# Patient Record
Sex: Male | Born: 1965 | Race: White | Hispanic: No | Marital: Single
Health system: Southern US, Community
[De-identification: ages and names within clinical notes are randomized; demographics above are authoritative.]

---

## 2013-03-16 ENCOUNTER — Emergency Department: Payer: Self-pay | Admitting: Emergency Medicine

## 2013-03-16 LAB — DRUG SCREEN, URINE
Barbiturates, Ur Screen: NEGATIVE (ref ?–200)
Cocaine Metabolite,Ur ~~LOC~~: NEGATIVE (ref ?–300)
Opiate, Ur Screen: NEGATIVE (ref ?–300)
Phencyclidine (PCP) Ur S: NEGATIVE (ref ?–25)
Tricyclic, Ur Screen: NEGATIVE (ref ?–1000)

## 2013-03-16 LAB — CBC
HGB: 17.8 g/dL (ref 13.0–18.0)
MCHC: 33.9 g/dL (ref 32.0–36.0)
MCV: 94 fL (ref 80–100)
RBC: 5.57 10*6/uL (ref 4.40–5.90)
RDW: 14.4 % (ref 11.5–14.5)
WBC: 6.7 10*3/uL (ref 3.8–10.6)

## 2013-03-16 LAB — TSH: Thyroid Stimulating Horm: 2.97 u[IU]/mL

## 2013-03-16 LAB — COMPREHENSIVE METABOLIC PANEL
Albumin: 4.3 g/dL (ref 3.4–5.0)
Alkaline Phosphatase: 89 U/L (ref 50–136)
Anion Gap: 3 — ABNORMAL LOW (ref 7–16)
BUN: 8 mg/dL (ref 7–18)
Co2: 29 mmol/L (ref 21–32)
Creatinine: 0.95 mg/dL (ref 0.60–1.30)
EGFR (African American): >60
Glucose: 70 mg/dL (ref 65–99)
Potassium: 3.9 mmol/L (ref 3.5–5.1)
SGPT (ALT): 28 U/L (ref 12–78)
Total Protein: 8.3 g/dL — ABNORMAL HIGH (ref 6.4–8.2)

## 2013-03-16 LAB — ACETAMINOPHEN LEVEL: Acetaminophen: <2 ug/mL

## 2013-03-16 LAB — ETHANOL: Ethanol %: 0.138 % — ABNORMAL HIGH (ref 0.000–0.080)

## 2013-03-16 LAB — SALICYLATE LEVEL: Salicylates, Serum: 5.7 mg/dL — ABNORMAL HIGH

## 2013-04-14 ENCOUNTER — Inpatient Hospital Stay: Payer: Self-pay | Admitting: Internal Medicine

## 2013-04-14 LAB — URINALYSIS, COMPLETE
Bacteria: NONE SEEN
Bilirubin,UR: NEGATIVE
Glucose,UR: NEGATIVE mg/dL (ref 0–75)
Leukocyte Esterase: NEGATIVE
Ph: 5 (ref 4.5–8.0)
Specific Gravity: 1.015 (ref 1.003–1.030)

## 2013-04-14 LAB — CBC
HCT: 40.8 % (ref 40.0–52.0)
HGB: 13.9 g/dL (ref 13.0–18.0)
MCH: 32.3 pg (ref 26.0–34.0)
MCV: 94 fL (ref 80–100)
RDW: 13.8 % (ref 11.5–14.5)
WBC: 20.8 10*3/uL — ABNORMAL HIGH (ref 3.8–10.6)

## 2013-04-14 LAB — TROPONIN I
Troponin-I: <0.02 ng/mL
Troponin-I: <0.02 ng/mL

## 2013-04-14 LAB — BASIC METABOLIC PANEL
BUN: 11 mg/dL (ref 7–18)
Calcium, Total: 9.2 mg/dL (ref 8.5–10.1)
Chloride: 98 mmol/L (ref 98–107)
Creatinine: 0.79 mg/dL (ref 0.60–1.30)
EGFR (African American): >60
Glucose: 88 mg/dL (ref 65–99)
Osmolality: 263 (ref 275–301)
Potassium: 4.4 mmol/L (ref 3.5–5.1)

## 2013-04-14 LAB — CK TOTAL AND CKMB (NOT AT ARMC)
CK, Total: 159 U/L (ref 35–232)
CK, Total: 87 U/L (ref 35–232)
CK-MB: 4.4 ng/mL — ABNORMAL HIGH (ref 0.5–3.6)
CK-MB: 2.7 ng/mL (ref 0.5–3.6)

## 2013-04-28 ENCOUNTER — Emergency Department: Payer: Self-pay | Admitting: Emergency Medicine

## 2013-08-01 ENCOUNTER — Emergency Department: Payer: Self-pay | Admitting: Emergency Medicine

## 2013-08-01 LAB — COMPREHENSIVE METABOLIC PANEL
ALBUMIN: 4 g/dL (ref 3.4–5.0)
ALK PHOS: 64 U/L
ALT: 21 U/L (ref 12–78)
AST: 27 U/L (ref 15–37)
Anion Gap: 6 — ABNORMAL LOW (ref 7–16)
BUN: 10 mg/dL (ref 7–18)
Bilirubin,Total: 0.3 mg/dL (ref 0.2–1.0)
CALCIUM: 8.9 mg/dL (ref 8.5–10.1)
CREATININE: 0.82 mg/dL (ref 0.60–1.30)
Chloride: 105 mmol/L (ref 98–107)
Co2: 24 mmol/L (ref 21–32)
EGFR (African American): >60
GLUCOSE: 90 mg/dL (ref 65–99)
OSMOLALITY: 269 (ref 275–301)
POTASSIUM: 3.6 mmol/L (ref 3.5–5.1)
Sodium: 135 mmol/L — ABNORMAL LOW (ref 136–145)
Total Protein: 7.8 g/dL (ref 6.4–8.2)

## 2013-08-01 LAB — CK TOTAL AND CKMB (NOT AT ARMC)
CK, TOTAL: 88 U/L
CK-MB: 1.3 ng/mL (ref 0.5–3.6)

## 2013-08-01 LAB — CBC
HCT: 45.9 % (ref 40.0–52.0)
HGB: 15.3 g/dL (ref 13.0–18.0)
MCH: 31.6 pg (ref 26.0–34.0)
MCHC: 33.3 g/dL (ref 32.0–36.0)
MCV: 95 fL (ref 80–100)
Platelet: 332 10*3/uL (ref 150–440)
RBC: 4.85 10*6/uL (ref 4.40–5.90)
RDW: 13.6 % (ref 11.5–14.5)
WBC: 8.7 10*3/uL (ref 3.8–10.6)

## 2013-08-01 LAB — PROTIME-INR
INR: 1
PROTHROMBIN TIME: 13.4 s (ref 11.5–14.7)

## 2013-08-01 LAB — ETHANOL
Ethanol %: 0.194 % — ABNORMAL HIGH (ref 0.000–0.080)
Ethanol: 194 mg/dL

## 2013-08-01 LAB — TROPONIN I: Troponin-I: <0.02 ng/mL

## 2013-08-01 LAB — APTT: ACTIVATED PTT: 26.9 s (ref 23.6–35.9)

## 2013-10-31 IMAGING — CR DG OUTSIDE FILMS BODY
1 series · 1 of 1 positions shown · non-contrast
Comparison: none

[ap]
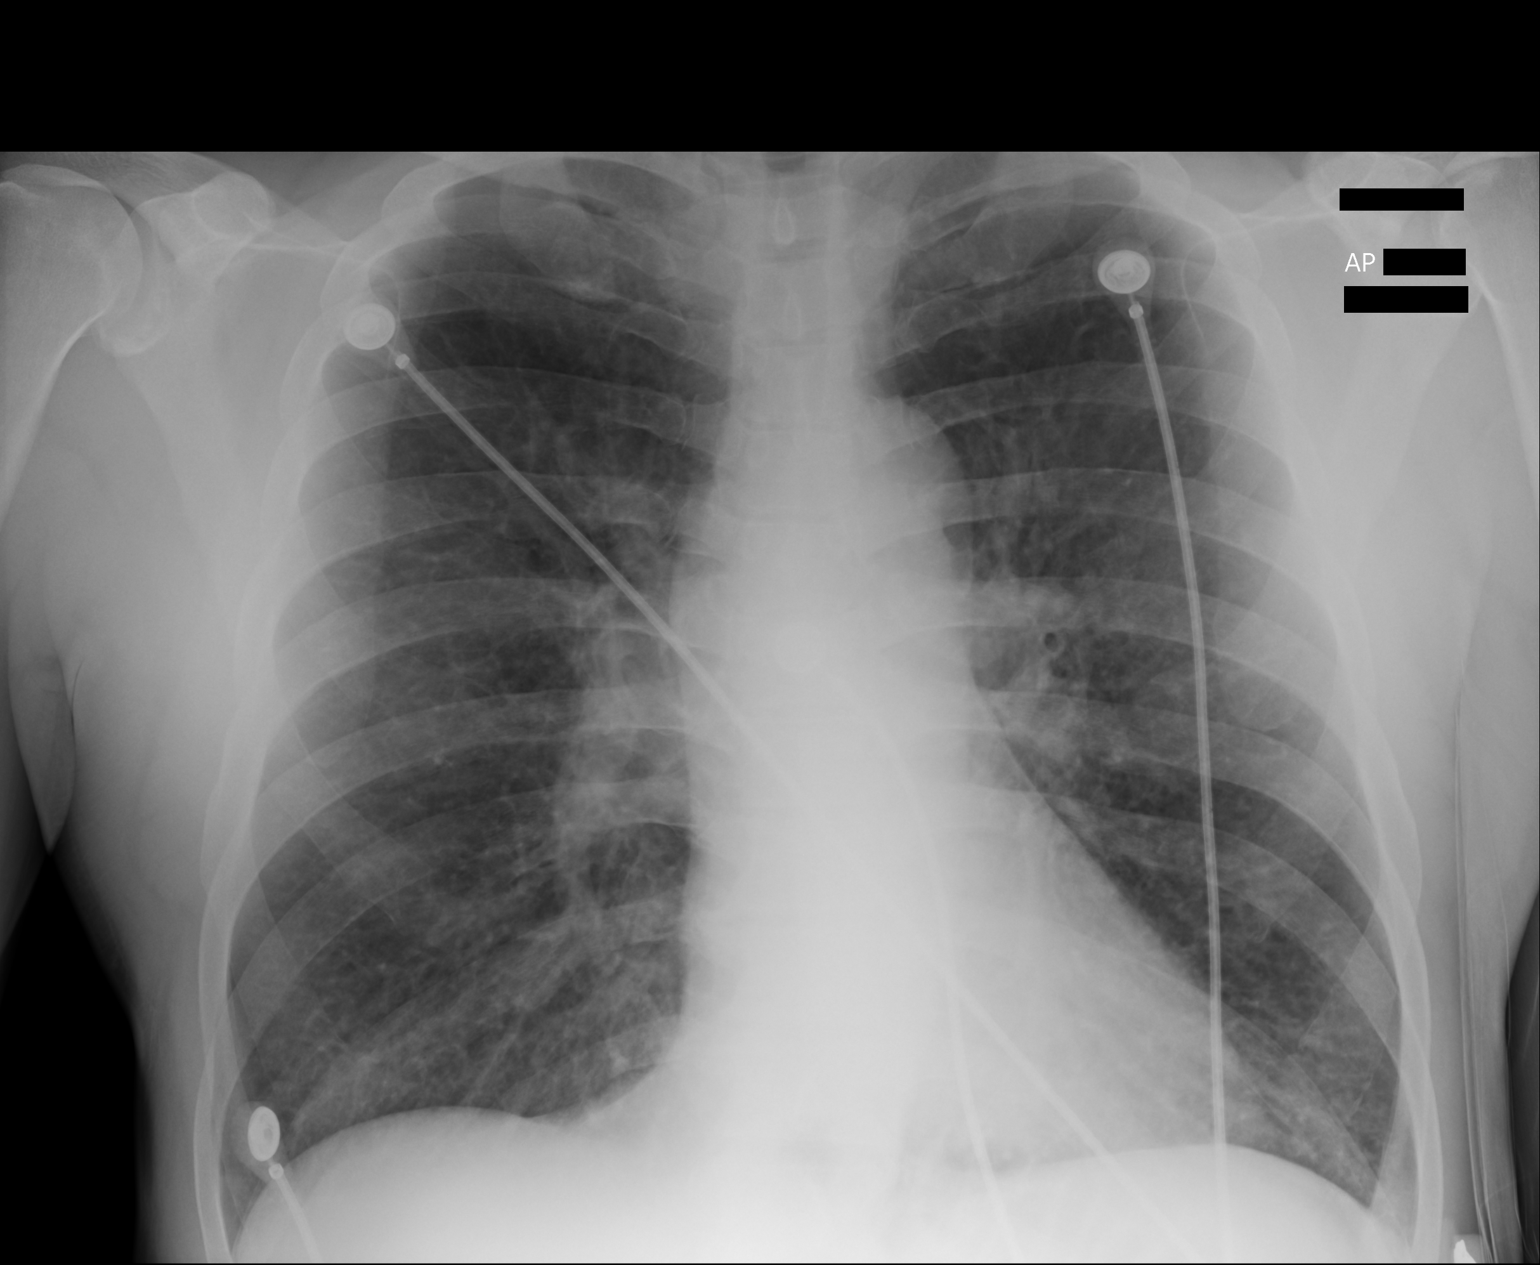

[1 of 1 positions shown; findings below may reference images not displayed]

Canned report from images found in remote index.

Refer to host system for actual result text.
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
## 2014-02-18 IMAGING — CR DG CHEST 1V PORT
1 series · 1 of 1 positions shown · non-contrast
Comparison: None.

CLINICAL DATA: Chest tightness and elevated blood pressure.

EXAM:
PORTABLE CHEST - 1 VIEW

[ap]
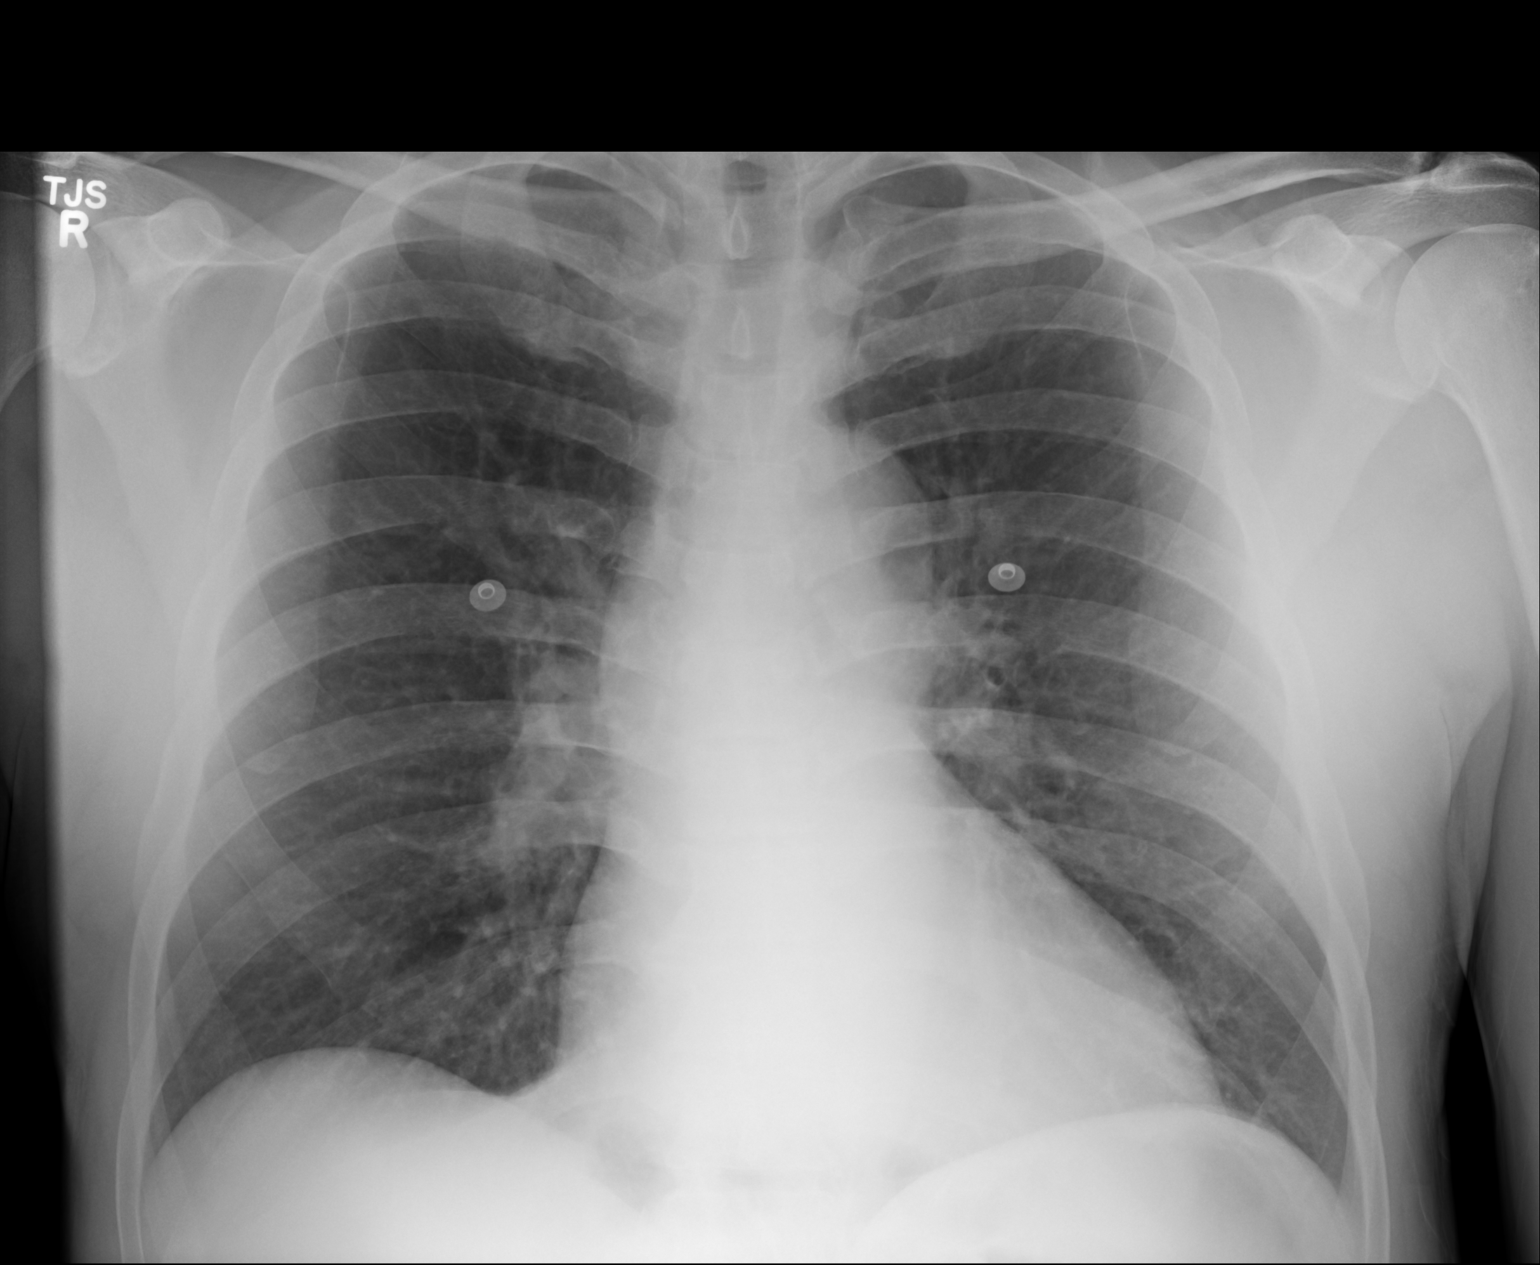

[1 of 1 positions shown; findings below may reference images not displayed]

FINDINGS: Lungs clear. Heart size normal. No pneumothorax or pleural effusion.
No focal bony abnormality.
IMPRESSION: Negative chest.

## 2014-02-21 DEATH — deceased

## 2014-10-13 NOTE — Discharge Summary (Signed)
PATIENT NAME:  Bryan Townsend, Bryan Townsend MR#:  [id number] DATE OF BIRTH:  04-Jan-1966  DATE OF ADMISSION:  04/14/2013 DATE OF DISCHARGE:  04/14/2013  The patient left AGAINST MEDICAL ADVICE.  No FINAL DIAGNOSES will be given since the patient left AMA.   PRESENTING COMPLAINT:  Shortness of breath and cough.   HISTORY OF PRESENT ILLNESS:  Bryan Townsend is a 49 year old Caucasian gentleman with a history of chronic tobacco abuse who comes to the Emergency Room with shortness of breath. He was admitted with acute hypoxic respiratory failure, was on BiPAP in the Emergency Room, which was weaned off to nasal cannula. He was started on Solu-Medrol and DuoNebs along with oral inhalers and empiric antibiotics. He did have some chest pain; however, his cardiac enzymes remained negative. The patient also has history of chronic headaches. He decided to leave AMA and hence FINAL DISCHARGE DIAGNOSES will not be given. He remained a FULL CODE.   TIME SPENT:  15 minutes.   ____________________________ Wylie HailSona A. Allena KatzPatel, MD sap:jm D: 04/15/2013 15:07:59 ET T: 04/15/2013 16:52:42 ET JOB#: [id number]  cc: Kajal Scalici A. Allena KatzPatel, MD, <Dictator> Willow OraSONA A Kalyn Dimattia MD ELECTRONICALLY SIGNED 04/17/2013 10:57

## 2014-10-13 NOTE — H&P (Signed)
PATIENT NAME:  Bryan Townsend, Bryan Townsend MR#:  [id number] DATE OF BIRTH:  May 26, 1966  DATE OF ADMISSION:  04/14/2013  REFERRING PHYSICIAN: Dr. Margarita GrizzleWoodruff.   PRIMARY CARE PHYSICIAN: None.   CHIEF COMPLAINT: Shortness of breath and chest pain and cough.   HISTORY OF PRESENT ILLNESS: This is a 49 year old male with significant past medical history of COPD and coronary artery disease status post stent, presents with complaints of shortness of breath, cough and chest pain. The patient reports that he has been smoking 1-1/2 packs a day since the age of 49. He reports he has been having 5 days of shortness of breath and cough with productive green sputum. Denies any fever or chills. The patient reports he has a history of COPD, but he is not taking any meds at home. As well, he reports over the last 2 days he has also been having some midsternal chest discomfort, nonradiating. He reports this does not resemble the chest pain when he had his MI in the past. In the ED, the patient was significantly in respiratory distress, where he required BiPAP. Upon my interview with the patient, whenever I removed the BiPAP, he desaturated to the low 80s on room air. The patient's chest x-ray did show COPD picture but did not show any acute opacity or infiltrate. The patient had mildly elevated BNP but denies any leg swelling or orthopnea. The patient reports significant improvement on BiPAP. The patient was found to have leukocytosis at 20,000. He denies any polyuria or dysuria. For chest pain, he was given 324 of aspirin. Denies any current chest pain. Had no EKG changes, with a negative troponin. The patient's ABG is showing pH of 7.32 and pCO2 of 48. Hospitalist service was requested to admit the patient for further treatment of his acute respiratory failure and COPD and evaluation of his chest pain.   PAST MEDICAL HISTORY:  1. COPD.  2. Coronary artery disease, status post cardiac stent.   ALLERGIES: No known drug allergies.   HOME  MEDICATIONS: None.   PAST SURGICAL HISTORY: Hernia surgery and left arm surgery.   FAMILY HISTORY: The patient reports history of coronary artery disease in the family but not at a young age.   SOCIAL HISTORY: The patient smokes 1-1/2 packs per day. Denies any alcohol use. Denies any illicit drug use. The patient tested positive for benzodiazepines and cannabinoids in the past in September 2014.   REVIEW OF SYSTEMS:  CONSTITUTIONAL: The patient denies fever, chills. Denies weakness, fatigue.  EYES: Denies blurry vision, double vision, inflammation, glaucoma.   ENT: Denies tinnitus, ear pain, epistaxis or discharge. RESPIRATORY: Complains of cough, wheezing, dyspnea and productive green sputum. Denies any hemoptysis.  CARDIOVASCULAR: Had chest discomfort. Denies edema, arrhythmia, palpitation, syncope.  GASTROINTESTINAL: Denies nausea, vomiting, diarrhea, abdominal pain, hematemesis, melena.  GENITOURINARY: Denies dysuria, hematuria, renal colic.  ENDOCRINE: Denies polyuria, polydipsia, heat or cold intolerance.  HEMATOLOGY: Denies anemia, easy bruising, bleeding diathesis.  INTEGUMENTARY: Denies acne, rash or skin lesions.  MUSCULOSKELETAL: Denies any gout, arthritis or cramps.  NEUROLOGIC: Denies numbness, weakness, dysarthria, epilepsy, CVA, TIA.   PSYCHIATRIC: Denies anxiety, insomnia, bipolar disorder.   PHYSICAL EXAMINATION:  VITAL SIGNS: Temperature 98.3, pulse 94, respiratory rate 24, blood pressure 141/82, saturating 99% on BiPAP.  GENERAL: Young male sitting on bed in mild respiratory distress on BiPAP.  HEENT: Head atraumatic, normocephalic. Pupils equal and reactive to light. Pink conjunctivae. Anicteric sclerae. Moist oral mucosa.  NECK: Supple. No thyromegaly. No JVD.  CHEST: Had significantly decreased  air entry bilaterally with diffuse wheezing. Cannot speak full sentences due to shortness of breath. Whenever he is taken off BiPAP, he desaturates into the low 80s.   CARDIOVASCULAR: S1, S2 heard. No rubs, murmurs or gallops.  ABDOMEN: Soft, nontender, nondistended. Bowel sounds present.  EXTREMITIES: No edema. No clubbing. No cyanosis.  PSYCHIATRIC: Appropriate affect. Awake, alert x 3. Intact judgment and insight.  NEUROLOGIC: Cranial nerves grossly intact. Motor is 5 out of 5. No focal deficits.  LYMPHATIC: No cervical or supraclavicular lymphadenopathy.   SKIN: Normal skin turgor. Warm and dry. No rash.   PERTINENT LABORATORIES: Glucose 88, BNP 530, BUN 11, creatinine 0.79, sodium 132, potassium 4.4, chloride 98, CO2 of 25. Troponin 0.02, CK-MB 5.5, total CK 276.   White blood cells 20.8, hemoglobin 13.9, hematocrit 40.8, platelets 424. PH 7.32, pCO2 48, pO2 96.   Chest x-ray showing COPD picture with no opacity or infiltrate.   EKG showing sinus tachycardia at 113 beats per minute, without significant T wave depression.   ASSESSMENT AND PLAN:  1. Acute respiratory failure: The patient appears to be having acute hypoxic respiratory failure. He is on BiPAP. This is most likely related to his chronic obstructive pulmonary disease exacerbation. Will continue him on BiPAP.  2. Chronic obstructive pulmonary disease exacerbation: Will continue the patient on Solu-Medrol 125 every 6 hours, on DuoNebs every 4 and albuterol every 2 hours as needed. Given the fact he is having productive sputum with cough and leukocytosis, will be started on Levaquin.  3. Chest pain: Currently, the patient is chest pain-free. Reports this pain does not resemble the chest pain he had when he had his heart attack in the past. He was already given 324 of aspirin. Will admit him to telemetry unit. Will continue to cycle his cardiac enzymes and follow the trend, and will have him on sublingual nitroglycerin.  4. Leukocytosis: Etiology is unclear. Will check septic workup, blood culture and urinalysis.  5. History of coronary artery disease: Will start the patient on aspirin.  6.  Tobacco abuse: The patient was counseled. Will be started on NicoDerm patch.  7. Deep vein thrombosis prophylaxis. Subcutaneous heparin.  8. Gastrointestinal prophylaxis: On proton pump inhibitor, especially he is on large dose steroids.   CODE STATUS: FULL CODE.   TOTAL TIME SPENT ON ADMISSION AND PATIENT CARE: 55 minutes.   ____________________________ Starleen Arms, MD dse:gb D: 04/14/2013 03:49:37 ET T: 04/14/2013 04:23:50 ET JOB#: [id number]  cc: Starleen Arms, MD, <Dictator> Roddie Riegler Teena Irani MD ELECTRONICALLY SIGNED 04/15/2013 3:02
